# Patient Record
Sex: Female | Born: 1940
Health system: Southern US, Community
[De-identification: ages and names within clinical notes are randomized; demographics above are authoritative.]

---

## 2005-04-17 ENCOUNTER — Ambulatory Visit: Payer: Self-pay | Admitting: Family Medicine

## 2005-06-11 ENCOUNTER — Ambulatory Visit: Payer: Self-pay | Admitting: Family Medicine

## 2006-08-16 ENCOUNTER — Emergency Department: Payer: Self-pay | Admitting: Emergency Medicine

## 2006-08-22 ENCOUNTER — Emergency Department: Payer: Self-pay | Admitting: Emergency Medicine

## 2008-07-26 ENCOUNTER — Ambulatory Visit: Payer: Self-pay | Admitting: Family Medicine

## 2009-09-10 ENCOUNTER — Observation Stay: Payer: Self-pay | Admitting: Internal Medicine

## 2010-04-09 ENCOUNTER — Other Ambulatory Visit: Payer: Self-pay | Admitting: Ophthalmology

## 2012-10-13 ENCOUNTER — Emergency Department: Payer: Self-pay

## 2012-10-13 LAB — BASIC METABOLIC PANEL
Anion Gap: 8 (ref 7–16)
BUN: 9 mg/dL (ref 7–18)
Co2: 28 mmol/L (ref 21–32)
Creatinine: 0.91 mg/dL (ref 0.60–1.30)
EGFR (African American): >60
Glucose: 102 mg/dL — ABNORMAL HIGH (ref 65–99)
Potassium: 3.8 mmol/L (ref 3.5–5.1)

## 2012-10-13 LAB — TROPONIN I: Troponin-I: <0.02 ng/mL

## 2012-10-13 LAB — CBC
HCT: 37.1 % (ref 35.0–47.0)
MCH: 30 pg (ref 26.0–34.0)
MCV: 90 fL (ref 80–100)
Platelet: 315 10*3/uL (ref 150–440)
RBC: 4.15 10*6/uL (ref 3.80–5.20)
RDW: 12.9 % (ref 11.5–14.5)
WBC: 9.5 10*3/uL (ref 3.6–11.0)

## 2012-10-25 ENCOUNTER — Emergency Department: Payer: Self-pay | Admitting: Emergency Medicine

## 2012-10-25 LAB — CBC
HGB: 12.3 g/dL (ref 12.0–16.0)
MCH: 30 pg (ref 26.0–34.0)
MCHC: 33.5 g/dL (ref 32.0–36.0)
RBC: 4.1 10*6/uL (ref 3.80–5.20)
RDW: 12.9 % (ref 11.5–14.5)
WBC: 10 10*3/uL (ref 3.6–11.0)

## 2012-10-25 LAB — BASIC METABOLIC PANEL
Anion Gap: 9 (ref 7–16)
EGFR (African American): >60
EGFR (Non-African Amer.): >60
Glucose: 105 mg/dL — ABNORMAL HIGH (ref 65–99)
Osmolality: 273 (ref 275–301)
Potassium: 3.7 mmol/L (ref 3.5–5.1)
Sodium: 137 mmol/L (ref 136–145)

## 2012-10-27 ENCOUNTER — Observation Stay: Payer: Self-pay | Admitting: Internal Medicine

## 2012-10-27 DIAGNOSIS — I059 Rheumatic mitral valve disease, unspecified: Secondary | ICD-10-CM

## 2012-10-27 LAB — CBC
HCT: 35.4 % (ref 35.0–47.0)
MCH: 30.4 pg (ref 26.0–34.0)
RBC: 3.98 10*6/uL (ref 3.80–5.20)
RDW: 13 % (ref 11.5–14.5)

## 2012-10-27 LAB — COMPREHENSIVE METABOLIC PANEL
Albumin: 3.6 g/dL (ref 3.4–5.0)
Alkaline Phosphatase: 138 U/L — ABNORMAL HIGH (ref 50–136)
Anion Gap: 4 — ABNORMAL LOW (ref 7–16)
Bilirubin,Total: 0.6 mg/dL (ref 0.2–1.0)
Calcium, Total: 8.9 mg/dL (ref 8.5–10.1)
Chloride: 104 mmol/L (ref 98–107)
Co2: 29 mmol/L (ref 21–32)
Creatinine: 0.99 mg/dL (ref 0.60–1.30)
EGFR (African American): >60
EGFR (Non-African Amer.): 57 — ABNORMAL LOW
Potassium: 3.8 mmol/L (ref 3.5–5.1)
SGOT(AST): 29 U/L (ref 15–37)
SGPT (ALT): 22 U/L (ref 12–78)
Sodium: 137 mmol/L (ref 136–145)

## 2012-10-27 LAB — CK TOTAL AND CKMB (NOT AT ARMC)
CK, Total: 113 U/L (ref 21–215)
CK-MB: 2.6 ng/mL (ref 0.5–3.6)
CK-MB: 1.9 ng/mL (ref 0.5–3.6)

## 2012-10-27 LAB — TROPONIN I: Troponin-I: <0.02 ng/mL

## 2012-10-28 DIAGNOSIS — R079 Chest pain, unspecified: Secondary | ICD-10-CM

## 2012-10-28 LAB — CBC WITH DIFFERENTIAL/PLATELET
Basophil %: 0.4 %
Eosinophil #: 0.1 10*3/uL (ref 0.0–0.7)
Eosinophil %: 0.6 %
HCT: 35.4 % (ref 35.0–47.0)
HGB: 12 g/dL (ref 12.0–16.0)
Lymphocyte #: 2.2 10*3/uL (ref 1.0–3.6)
Lymphocyte %: 22 %
MCV: 89 fL (ref 80–100)
Monocyte #: 0.9 x10 3/mm (ref 0.2–0.9)
Neutrophil %: 68.3 %
RBC: 3.96 10*6/uL (ref 3.80–5.20)
RDW: 13.3 % (ref 11.5–14.5)
WBC: 9.8 10*3/uL (ref 3.6–11.0)

## 2012-10-28 LAB — BASIC METABOLIC PANEL
BUN: 11 mg/dL (ref 7–18)
Calcium, Total: 8.9 mg/dL (ref 8.5–10.1)
Co2: 28 mmol/L (ref 21–32)
Creatinine: 0.92 mg/dL (ref 0.60–1.30)
EGFR (African American): >60
Glucose: 109 mg/dL — ABNORMAL HIGH (ref 65–99)
Osmolality: 276 (ref 275–301)
Sodium: 138 mmol/L (ref 136–145)

## 2012-10-28 LAB — CK TOTAL AND CKMB (NOT AT ARMC): CK, Total: 97 U/L (ref 21–215)

## 2012-10-28 LAB — MAGNESIUM: Magnesium: 1.9 mg/dL

## 2012-10-28 LAB — LIPID PANEL
Ldl Cholesterol, Calc: 91 mg/dL (ref 0–100)
Triglycerides: 146 mg/dL (ref 0–200)
VLDL Cholesterol, Calc: 29 mg/dL (ref 5–40)

## 2012-10-28 LAB — TSH: Thyroid Stimulating Horm: 1.98 u[IU]/mL

## 2013-12-09 IMAGING — CR DG CHEST 2V
1 series · 2 of 2 positions shown · non-contrast
Comparison: none

REASON FOR EXAM: Chest Pain
COMMENTS:

[Series 1: w chest pa · 0.14mm/px · 2 of 2 slices shown]
[im 1/2]
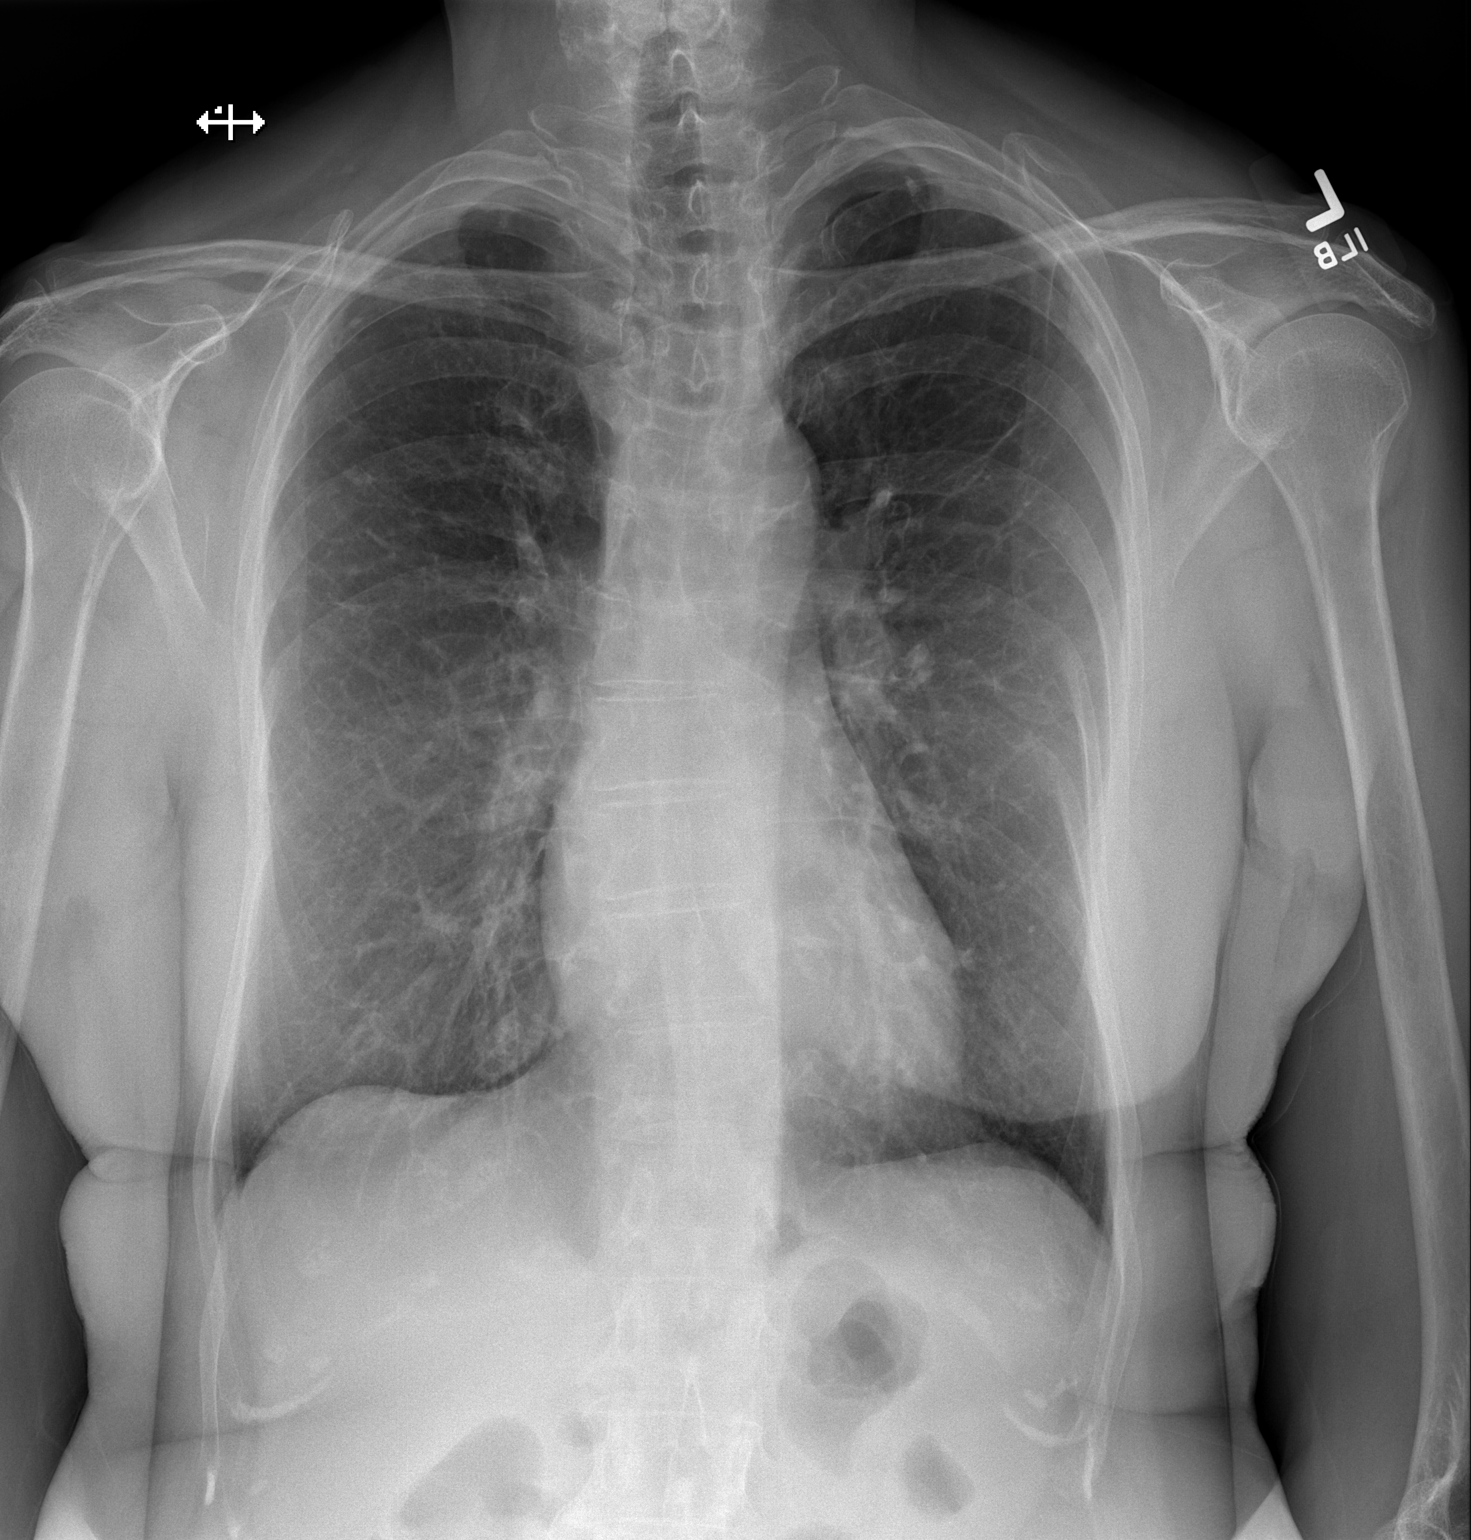
[im 2/2]
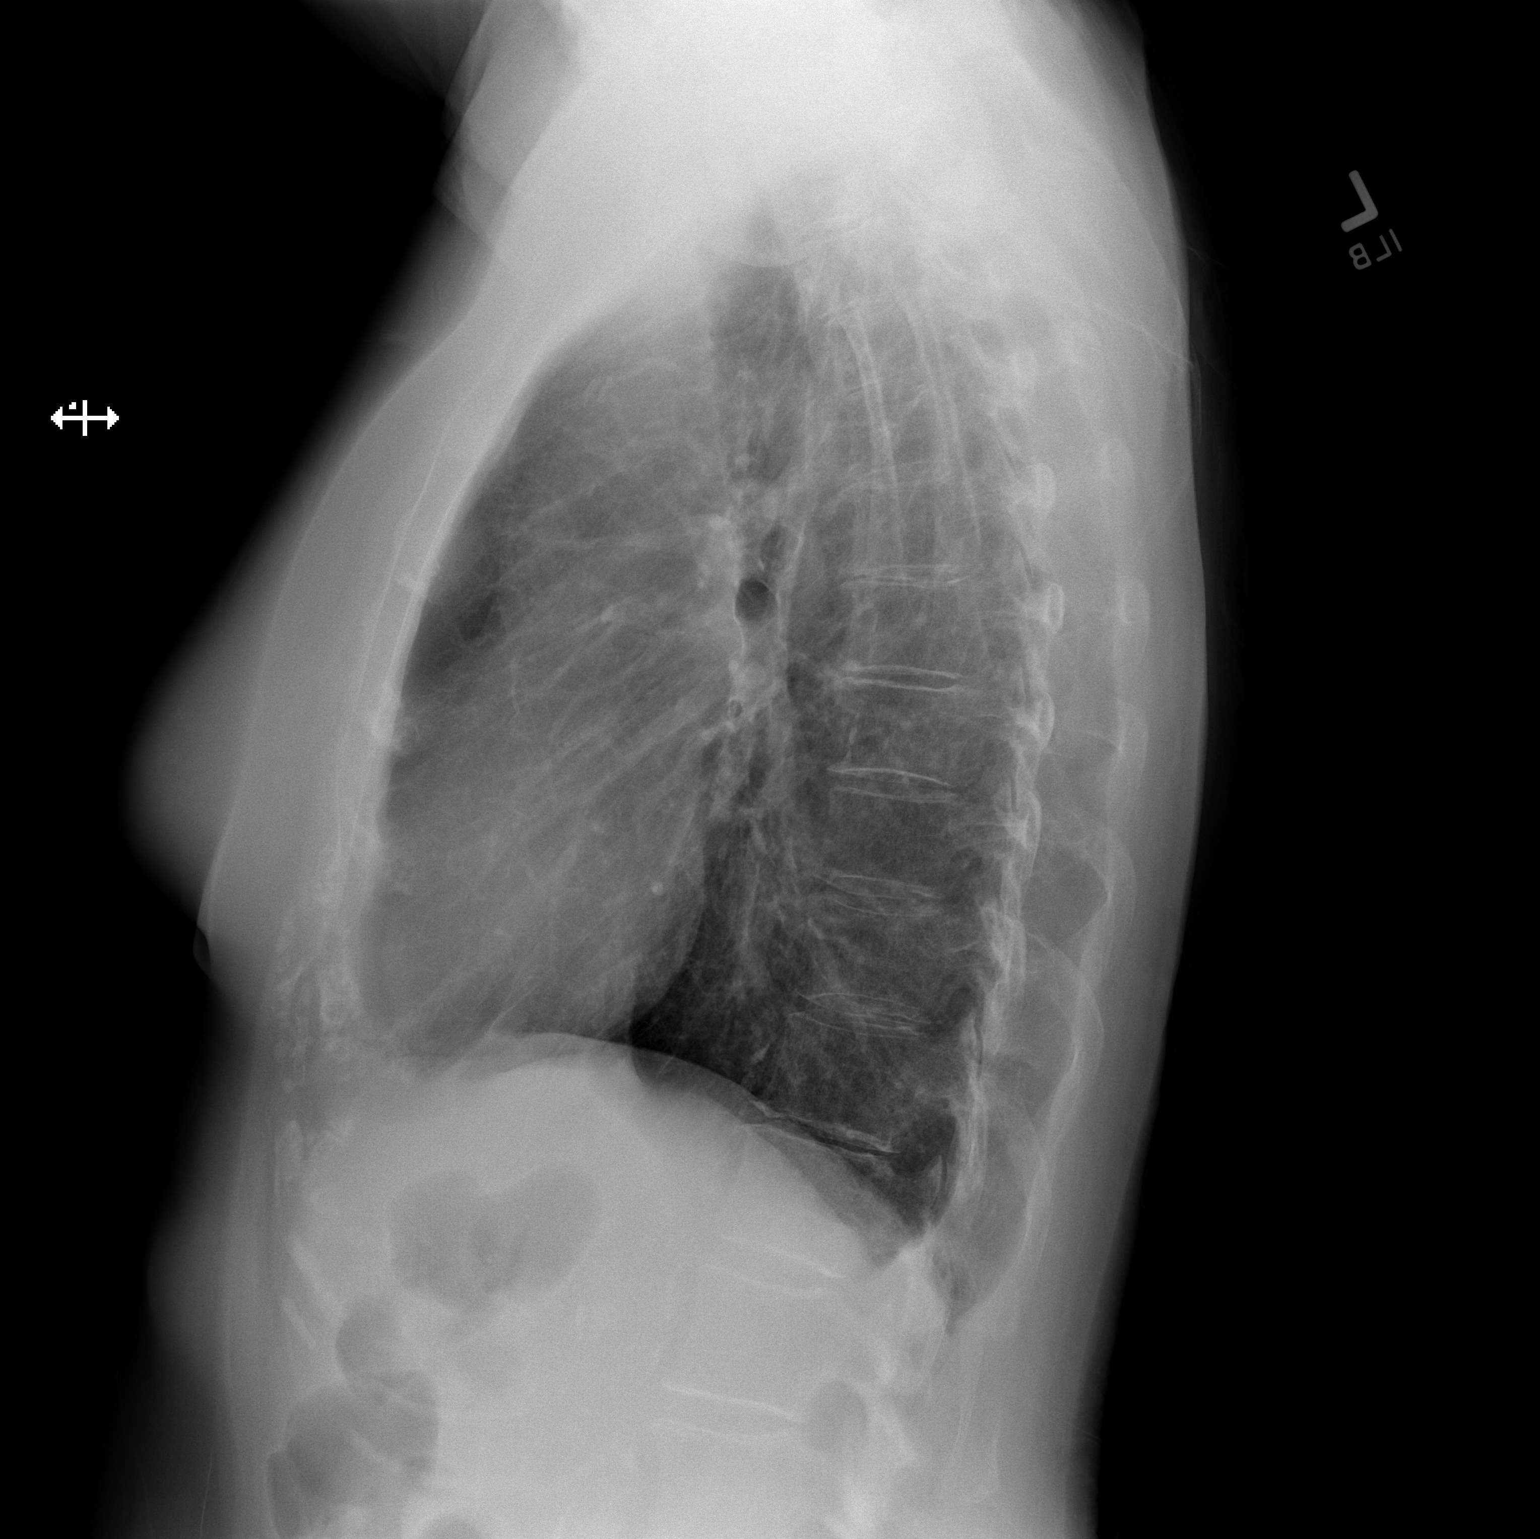

[2 of 2 positions shown; findings below may reference images not displayed]

PROCEDURE:     DXR - DXR CHEST PA (OR AP) AND LATERAL  - October 25, 2012  [DATE]

RESULT:     Comparison is made to study of 10/13/2012.

The lungs are hyperinflated consistent with COPD. The interstitial markings
are prominent, likely secondary to chronic inflammatory change and fibrosis.
There is a nodular areas in the right upper lobe laterally which are
unchanged. There is no edema, infiltrate, effusion or pneumothorax.
IMPRESSION: 1. Likely chronic pulmonary disease. No acute cardiopulmonary disease
appreciated at this time.

[REDACTED]

## 2013-12-11 IMAGING — CT CT HEAD WITHOUT CONTRAST
1 series · 16 of 30 positions shown, 20 images · non-contrast
Comparison: none

REASON FOR EXAM: transient loss of vision L eye yesterday
COMMENTS:

[Series 2: soft tissue · axial · 0.39mm/px · z∈[-52,+88]mm · 16 of 32 slices shown, 20 images]
[im 2/32  brain]
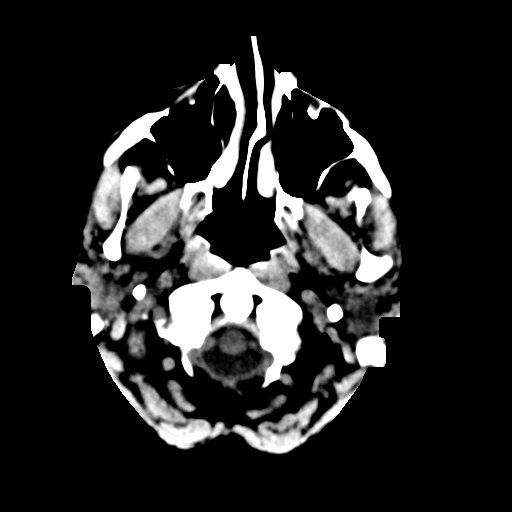
[im 2/32  bone]
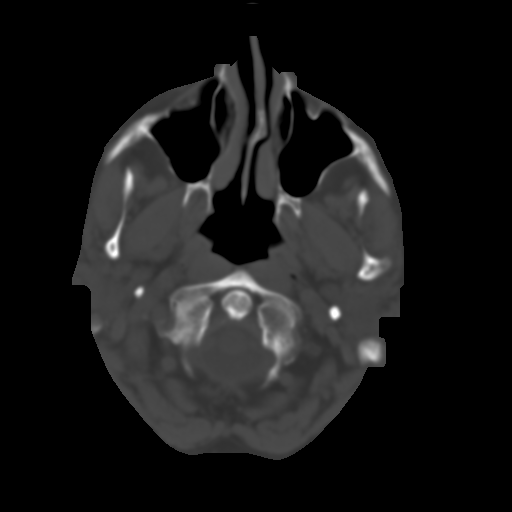
[im 4/32  brain]
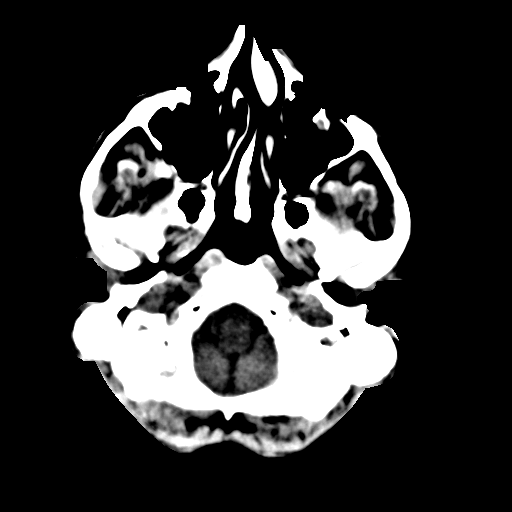
[im 6/32  brain]
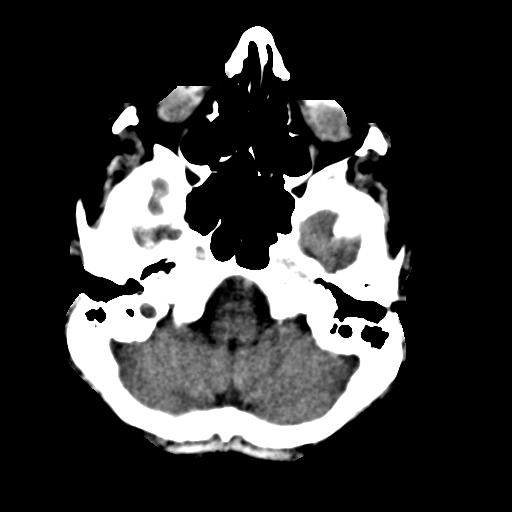
[im 8/32  brain]
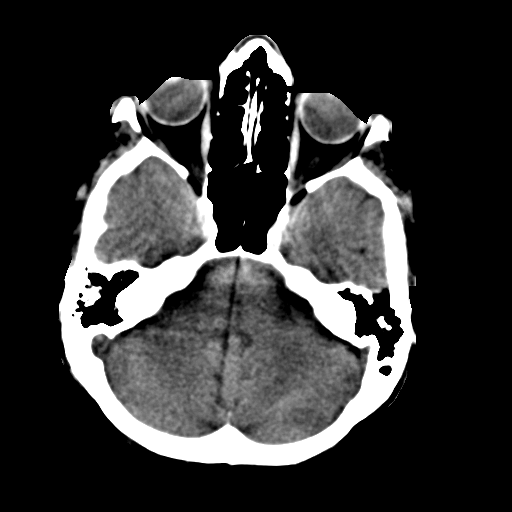
[im 9/32  brain]
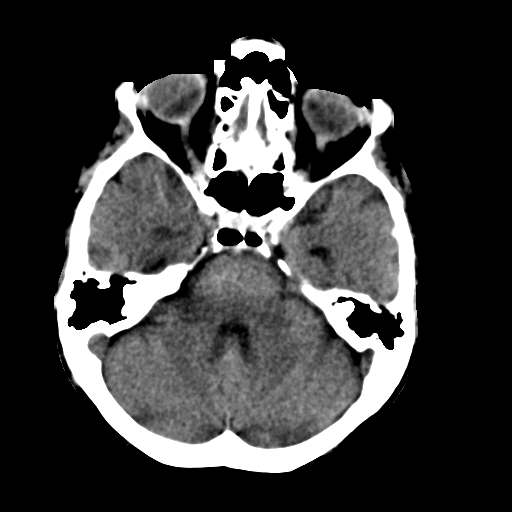
[im 9/32  bone]
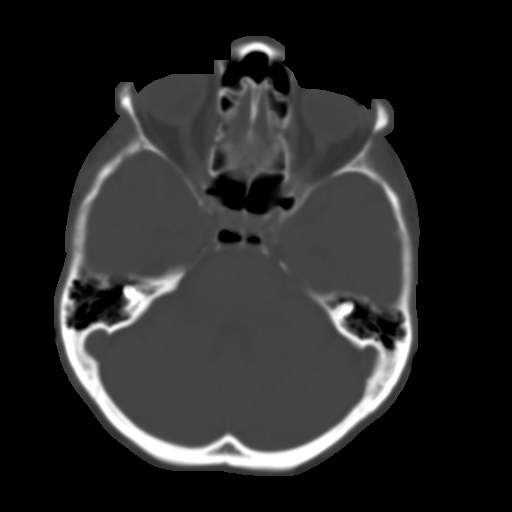
[im 11/32  brain]
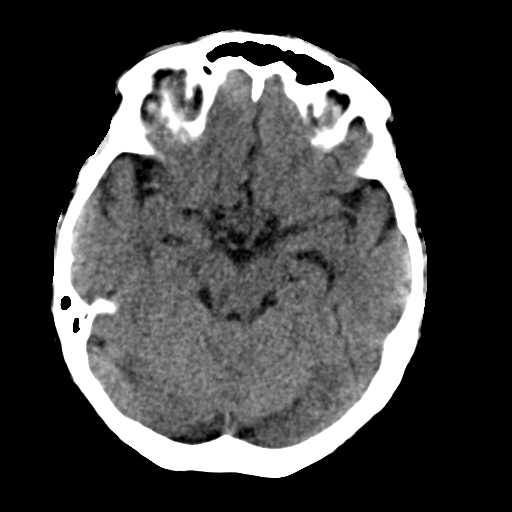
[im 13/32  brain]
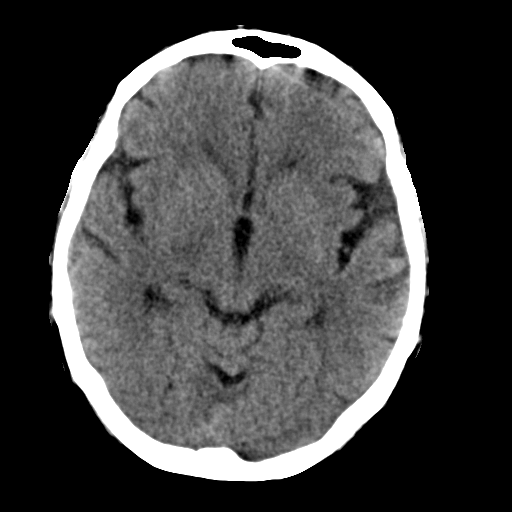
[im 15/32  brain]
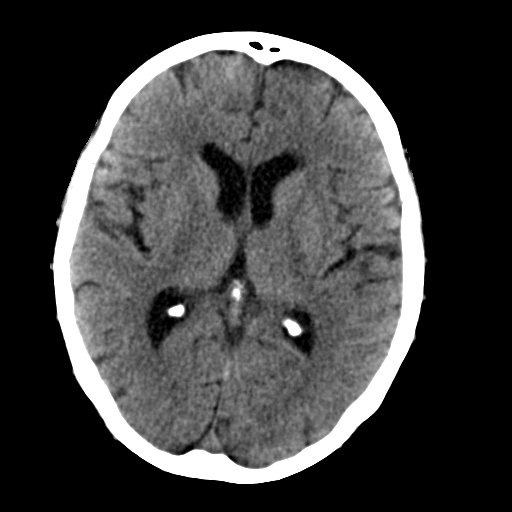
[im 17/32  brain]
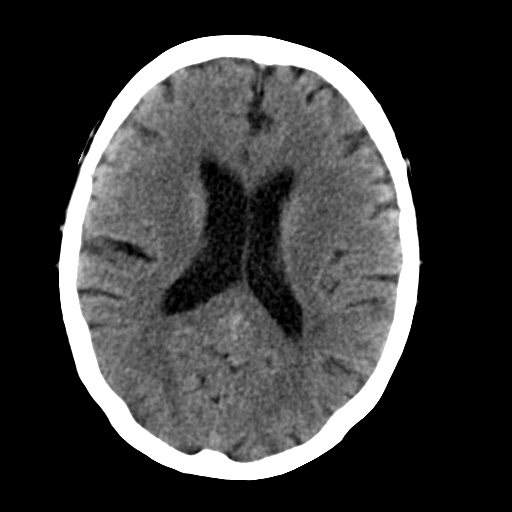
[im 17/32  bone]
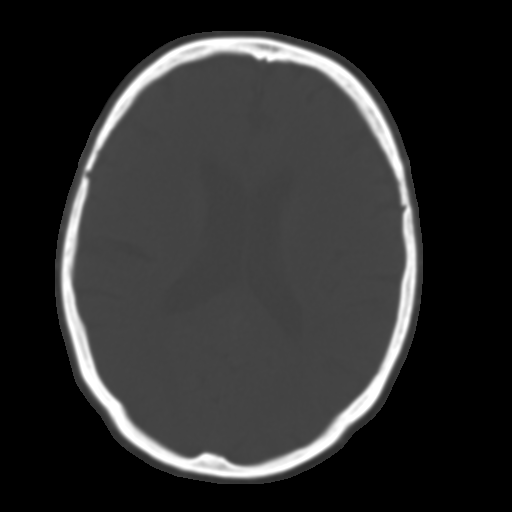
[im 19/32  brain]
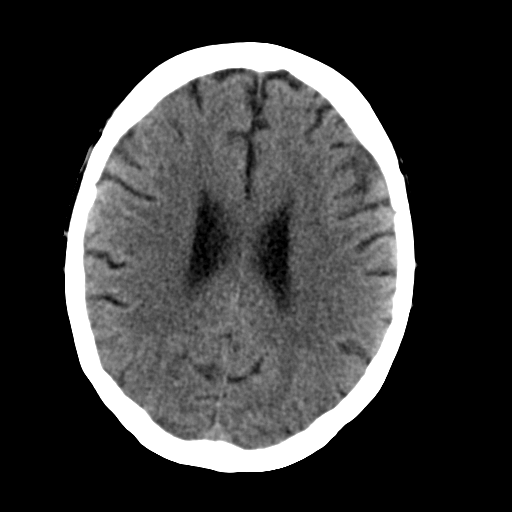
[im 21/32  brain]
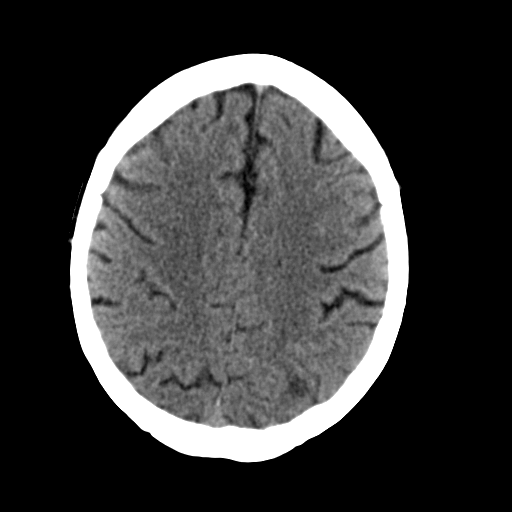
[im 23/32  brain]
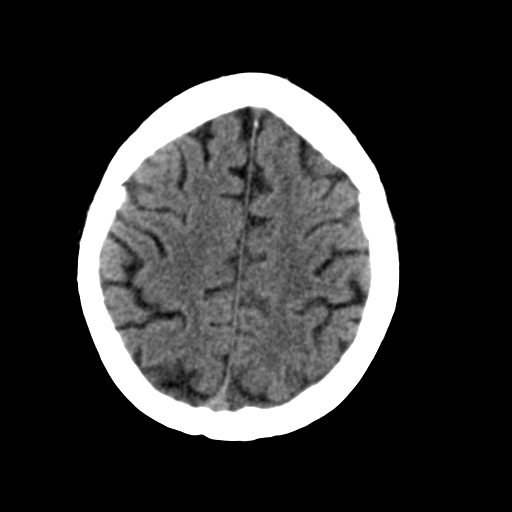
[im 24/32  brain]
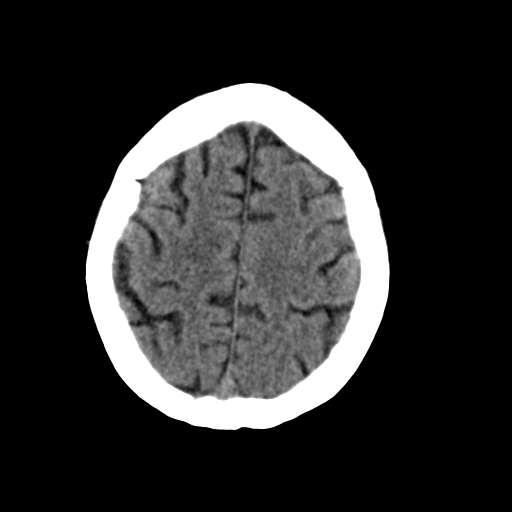
[im 24/32  bone]
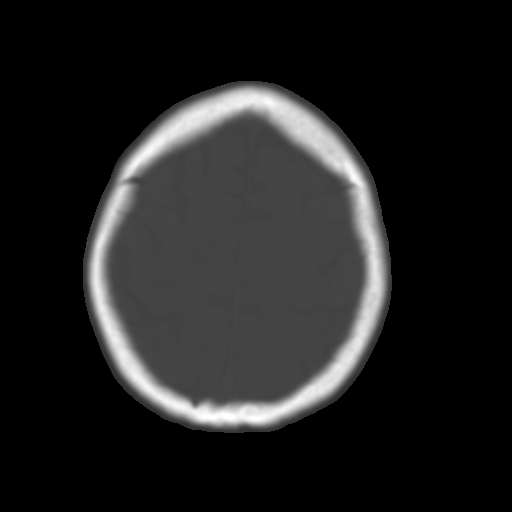
[im 26/32  brain]
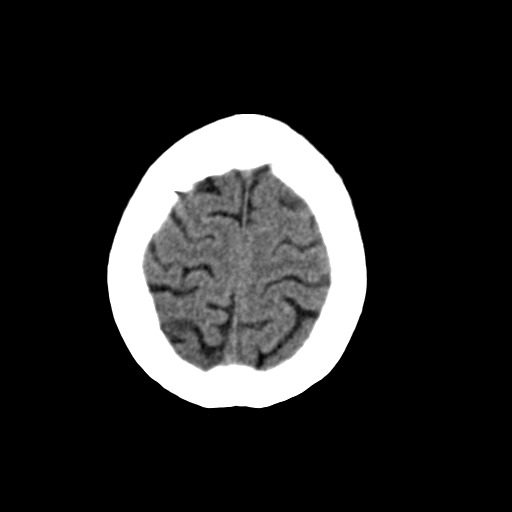
[im 28/32  brain]
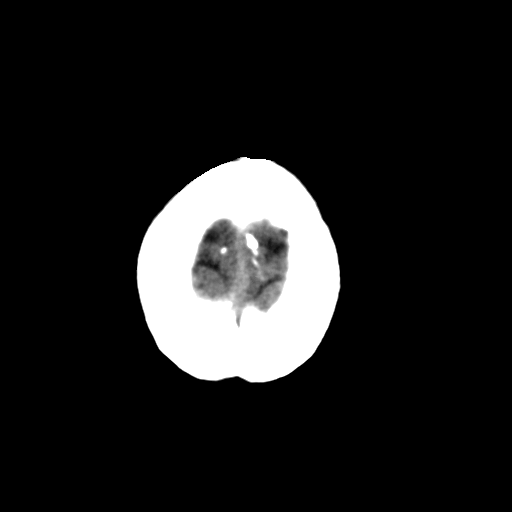
[im 30/32  brain]
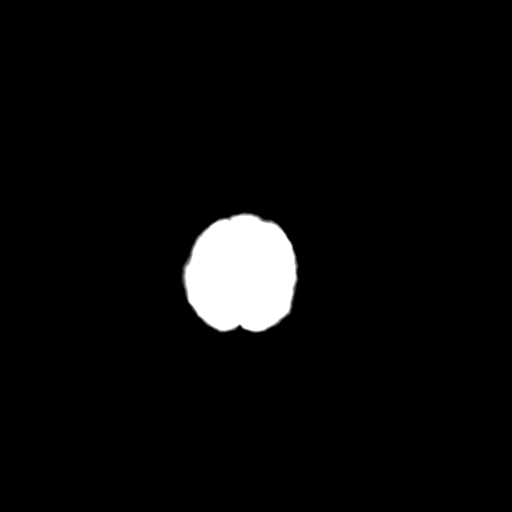

[16 of 30 positions shown; findings below may reference images not displayed]

PROCEDURE:     CT  - CT HEAD WITHOUT CONTRAST  - October 27, 2012 [DATE]

RESULT:     Is the colon transient vision loss.

Comparison Study: No prior.

Fines: No mass. No hydrocephalus. No hemorrhage. No acute bony abnormality
identified. White matter changes consistent with chronic ischemia present.
IMPRESSION: White matter changes present consistent with chronic
ischemia. Exam is otherwise unremarkable.

## 2014-08-03 NOTE — Discharge Summary (Signed)
PATIENT NAME:  Loretta Parks, Loretta Parks MR#:  [id number] DATE OF BIRTH:  01/29/1941  DATE OF ADMISSION:  10/27/2012 DATE OF DISCHARGE:  10/28/2012  ADMITTING DIAGNOSIS: Chest pressure.  DISCHARGE DIAGNOSES: 1.  Chest pressure, felt to be due to atypical chest pressure, negative Nuclear myocardial scan as well as echo without any significant wall motion abnormality. Likely cause for her symptoms is anxiety.  2.  Impaired left ventricular diastolic filling.  3.  Moderate mitral valve regurgitation.  4.  Mild aortic valve sclerosis without stenosis.  5.  Depression and anxiety.  6.  Chronic low back pain secondary to bulging disk.  7.  Hypercholesterolemia.  8.  Peripheral neuropathy.  9.  Status post hysterectomy.  10.  Status post tonsillectomy. 11.  Status post appendectomy.  12.  Status post partial corneal transplant.   CONSULTANTS: None.   PERTINENT LABS AND EVALUATIONS: EKG on admission showed normal sinus rhythm, possible lateral infarct.   Troponin less than 0.02. Glucose 105, BUN 9, creatinine 0.92, sodium 137, potassium 3.7, chloride 105, CO2 23 and calcium 9.4. WBC 10, hemoglobin 12.3 and platelet count 308. PA and lateral chest x-ray showed chronic pulmonary disease. No acute abnormality. Lipase 166. Troponin x 3 was less than 0.02. CT scan of the head showed no acute abnormality. Echocardiogram showed EF 55% to 60%. Normal left ventricular systolic function. Impaired diastolic relaxation filling. Total cholesterol 158, triglycerides 146, HDL 38 and LDL was 91. Nuclear myocardial perfusion scan showed no evidence of ischemia or infarct. No significant wall motion abnormality.  EF 63%.   HOSPITAL COURSE: Please refer to H and P done by the admitting physician. The patient is a 74 year old pleasant Caucasian female with history of hypertension, hyperlipidemia, peripheral neuropathy and anxiety disorder who has visited the ED for the third time for similar type of complaints. The patient  reports that her son died about a year ago and she has had issues with anxiety and has felt very nervous and has felt sometimes that she is going to die. The patient this time came to the ED with complaint of chest pressure again. She had evaluation in the ED which included EKG and cardiac enzymes, which were negative. Due to her recurrent symptoms and risk factors, she was admitted and placed under observation.  Serial cardiac enzymes remained negative. She had an echocardiogram which did not show any significant wall motion abnormality. She had a stress MIBI which was negative for ischemia or infarct. At this time, she is chest pain-free and is doing well and is stable for discharge.   DISCHARGE MEDICATIONS:  1.  Aspirin 81 mg 1 tab p.o. daily. 2.  Estradiol 1 mg daily. 3.  Metoprolol tartrate 50 mg 1 tab p.o. b.i.d. 4.  Simvastatin 20 mg at bedtime. 5.  Famotidine 20 mg 1 tab p.o. b.i.d. 6.  Sertraline 50 mg daily. 7.  Lisinopril 40 mg daily.   DISCHARGE DIET: Low sodium, low fat, low cholesterol.   DISCHARGE ACTIVITY: As tolerated.   DISCHARGE FOLLOWUP: With primary MD in 1 to 2 weeks.   TIME SPENT ON DISCHARGE:  32 minutes. ____________________________ Lacie ScottsShreyang H. Allena KatzPatel, MD shp:sb D: 10/28/2012 14:38:18 ET T: 10/28/2012 15:55:53 ET JOB#: [id number]  cc: Colonel Krauser H. Allena KatzPatel, MD, <Dictator> Charise CarwinSHREYANG H Ellinore Merced MD ELECTRONICALLY SIGNED 10/30/2012 13:11

## 2014-08-03 NOTE — H&P (Signed)
PATIENT NAME:  Loretta Parks, Loretta Parks MR#:  [id number] DATE OF BIRTH:  08/09/1940  DATE OF ADMISSION:  10/27/2012  PRIMARY CARE PHYSICIAN:  Dr. Sheppard Penton  REFERRING PHYSICIAN:  Dr. Glenetta Hew.   CHIEF COMPLAINT: Chest pressure.   HISTORY OF PRESENT ILLNESS: The patient is a pleasant, 74 year old, Caucasian female with history of hypertension, hyperlipidemia, peripheral neuropathy and anxiety disorder. She has been coming to the ER and this is her third visit for the same complaints. The patient states that a couple of weeks ago, she started to have off and on chest pressure without chest pain. There are no other associating factors with this.  The chest pressure not radiating. There is no nausea, no diaphoresis, no dizziness. It lasts about 1 to 2 minutes and goes away. This morning, however, she had another episode, but had some sweats. Of note, she had presented to the hospital a couple of days ago and was supposed to see Dr. Sheppard Penton, her PCP, today but as she had another episode of this chest pressure, she came in.   She does have no chest pressure now, but she has been having some stressors at home including a son who is drinking too much alcohol at this point. She has no nausea, vomiting, abdominal pain. Of note, her blood pressures have been running rather high 180s over 80s. Of note, she also stated that yesterday she was sitting in the recliner and went to sleep and as she woke up she thought she had no vision in the right eye. She blinked once and the vision returned. She had no other symptoms of weakness, speech issues, numbness and the symptoms which she thinks she had and lasted in the blink of an eye. Here she had a CAT scan of the head which was negative for any acute stroke.   PAST MEDICAL HISTORY: Hypertension, chronic lower back pain secondary to bulging disk, hypercholesterolemia, peripheral neuropathy, hot flashes, history of chest pressure in 2011 and negative stress test and was thought to be stress  related, anxiety disorder.   PAST SURGICAL HISTORY: Hysterectomy, tonsillectomy, appendectomy, partial corneal transplant.   ALLERGIES: No known drug allergies.   OUTPATIENT MEDICATIONS: Sertraline 50 mg daily, aspirin 81 mg daily, estradiol 1 mg daily, famotidine 20 mg 2 times a day, lisinopril 20 mg daily, metoprolol tartrate 50 mg 2 times a day, simvastatin 20 mg daily, sertraline 50 mg daily.  FAMILY HISTORY:  Stroke runs in her family. Father died with a stroke, a son died at a young age with complications from Wilson's disease.   SOCIAL HISTORY: Divorced, retired. No alcohol or drug use.   REVIEW OF SYSTEMS:   CONSTITUTIONAL: Feels just generally weak without any focal weakness. No fever. No weight changes.   EYES: No blurry vision or double vision. History of a partial corneal transplant in 2011.   EARS, NOSE, THROAT: No tinnitus or hearing loss.   RESPIRATORY: No cough, wheezing, shortness of breath or dyspnea on exertion.   CARDIOVASCULAR: Chest pressure as above. No chest pain. No orthopnea or swelling in the legs. No arrhythmia or congestive heart failure. No history of myocardial infarction or palpitations.   GASTROINTESTINAL: No nausea, vomiting, diarrhea, abdominal pain. No rectal bleeding or hematemesis.   GENITOURINARY: Denies dysuria, hematuria, frequency or incontinence.   ENDOCRINE: Denies polyuria or nocturia.   HEMATOLOGIC AND LYMPHATIC:  Denies anemia or easy bruising.   SKIN: Denies any rashes.   MUSCULOSKELETAL: Denies arthritis or gout.   PSYCHIATRIC: Has anxiety.  NEUROLOGIC: Denies focal weakness, numbness or stroke or transient ischemic attack.   PHYSICAL EXAMINATION:  VITAL SIGNS: Temperature on arrival 99.1, pulse rate 71, respiratory rate 20, blood pressure on arrival 180/81. Last blood pressure 189/88, oxygen saturation on arrival 97% on room air.   GENERAL: The patient is a well-developed Caucasian female lying in bed in no obvious  distress.   HEENT: Normocephalic, atraumatic. Pupils are equal and reactive. Extraocular muscles intact. Anicteric sclerae. Moist mucous membranes.   NECK: Supple. No thyroid tenderness or cervical lymphadenopathy.   CARDIOVASCULAR: S1, S2 regular rate and rhythm. No murmurs, rubs or gallops.   LUNGS: Clear to auscultation without wheezing, rhonchi or rales.   ABDOMEN: Soft, nontender, nondistended. Positive bowel sounds in all quadrants.   EXTREMITIES: No significant lower extremity edema.   NEUROLOGIC: Cranial nerves II through XII grossly intact. Strength is 5/5 in all extremities. Sensation is intact to light touch.   PSYCHIATRIC: Awake, alert, oriented x 3. Cooperative, pleasant.   SKIN: No obvious rashes.   LABORATORY DATA: Glucose 125, BUN 10, creatinine 0.99, sodium 137, potassium 3.8. LFTs showed alkaline phosphatase of 138, otherwise within normal limits. Troponin on 07/03 it was negative x 1. On 07/15, it was negative x 1 and today it is negative x 1. CBC within normal limits.   EKG: Normal sinus rhythm, rate 70.  There are some nonspecific ST changes, but no acute ST elevations or depressions.  CT of the head as above.   ASSESSMENT AND PLAN: We have a 74 year old Caucasian female with a history of hypertension, hyperlipidemia, anxiety disorder, who has been having off and on chest pressures for a couple of weeks and this is her third emergency room visit. She also has some anxiety issues pertaining to a son who is drinking too much alcohol. Given her persistent chest pressures, we will bring her in and rule out acute coronary syndrome as she certainly has several of the risk factors. We would cycle the troponins, resume aspirin, statin and a beta blocker. The patient does appear to have accelerated hypertension with systolics in the 180s. I would increase the lisinopril, add a low-dose amlodipine at this point and add hydralazine intravenous p.r.n. I will admit her to telemetry  with remote tele, morphine p.r.n. for pain and if she does rule out for acute coronary syndrome, a stress test has been ordered for tomorrow. She has no chest pain or pressure currently. I would check a lipid profile, a hemoglobin A1c and a TSH for risk stratification as well. I would also continue her sertraline at this point if this is deemed to be noncardiac perhaps. Her anxiety disorder should be evaluated further as an outpatient. I will start her on a proton pump inhibitor instead of the famotidine as she also did say she had some burning in her throat when she had some cappuccino. She denies overall excessive gastroesophageal reflux disease symptoms. I will start her on Lovenox for deep vein thrombosis prophylaxis.   Full code.   TOTAL TIME SPENT: 55 minutes.     ____________________________ Krystal EatonShayiq Kastiel Simonian, MD sa:rw D: 10/27/2012 11:48:35 ET T: 10/27/2012 12:20:41 ET JOB#: [id number]  cc: Krystal EatonShayiq Haji Delaine, MD, <Dictator> Mickie HillierJack H. Sheppard PentonWolf, MD Krystal EatonSHAYIQ Arriona Prest MD ELECTRONICALLY SIGNED 11/13/2012 11:33

## 2015-04-20 ENCOUNTER — Other Ambulatory Visit
Admission: RE | Admit: 2015-04-20 | Discharge: 2015-04-20 | Disposition: A | Discharge status: Home / Self Care | Payer: Medicare Other | Source: Ambulatory Visit | Attending: Family Medicine | Admitting: Family Medicine

## 2015-04-20 DIAGNOSIS — R4182 Altered mental status, unspecified: Secondary | ICD-10-CM | POA: Insufficient documentation

## 2015-04-20 LAB — URINALYSIS COMPLETE WITH MICROSCOPIC (ARMC ONLY)
BACTERIA UA: NONE SEEN
Bilirubin Urine: NEGATIVE
Glucose, UA: NEGATIVE mg/dL
Hgb urine dipstick: NEGATIVE
KETONES UR: NEGATIVE mg/dL
Leukocytes, UA: NEGATIVE
Nitrite: NEGATIVE
PH: 5 (ref 5.0–8.0)
PROTEIN: NEGATIVE mg/dL
RBC / HPF: NONE SEEN RBC/hpf (ref 0–5)
Specific Gravity, Urine: 1.025 (ref 1.005–1.030)
Squamous Epithelial / LPF: NONE SEEN
WBC UA: NONE SEEN WBC/hpf (ref 0–5)

## 2015-04-22 LAB — URINE CULTURE

## 2015-09-12 DEATH — deceased
